# Patient Record
Sex: Female | Born: 1980 | Hispanic: Yes | Marital: Married | Smoking: Never smoker
Health system: Southern US, Community
[De-identification: ages and names within clinical notes are randomized; demographics above are authoritative.]

---

## 2004-08-11 ENCOUNTER — Ambulatory Visit: Payer: Self-pay | Admitting: Family Medicine

## 2004-11-03 ENCOUNTER — Ambulatory Visit: Payer: Self-pay | Admitting: Family Medicine

## 2005-02-09 ENCOUNTER — Inpatient Hospital Stay: Payer: Self-pay | Admitting: Certified Nurse Midwife

## 2007-03-13 ENCOUNTER — Encounter: Payer: Self-pay | Admitting: Maternal & Fetal Medicine

## 2007-03-13 ENCOUNTER — Ambulatory Visit: Payer: Self-pay | Admitting: Certified Nurse Midwife

## 2007-03-17 ENCOUNTER — Encounter: Payer: Self-pay | Admitting: Maternal & Fetal Medicine

## 2007-06-02 ENCOUNTER — Encounter: Payer: Self-pay | Admitting: Maternal & Fetal Medicine

## 2007-06-30 ENCOUNTER — Encounter: Payer: Self-pay | Admitting: Maternal & Fetal Medicine

## 2009-06-11 ENCOUNTER — Emergency Department: Payer: Self-pay | Admitting: Emergency Medicine

## 2009-06-14 ENCOUNTER — Other Ambulatory Visit: Payer: Self-pay | Admitting: Emergency Medicine

## 2011-04-26 ENCOUNTER — Encounter: Payer: Self-pay | Admitting: Maternal and Fetal Medicine

## 2011-05-24 ENCOUNTER — Encounter: Payer: Self-pay | Admitting: Obstetrics and Gynecology

## 2011-05-29 ENCOUNTER — Encounter: Payer: Self-pay | Admitting: Pediatric Cardiology

## 2011-10-24 ENCOUNTER — Inpatient Hospital Stay: Payer: Self-pay | Admitting: Obstetrics and Gynecology

## 2011-10-24 LAB — CBC WITH DIFFERENTIAL/PLATELET
Basophil %: 1.2 %
Eosinophil #: 0.1 10*3/uL (ref 0.0–0.7)
Eosinophil %: 0.8 %
HCT: 34.5 % — ABNORMAL LOW (ref 35.0–47.0)
HGB: 11.5 g/dL — ABNORMAL LOW (ref 12.0–16.0)
Lymphocyte %: 27 %
MCH: 28.7 pg (ref 26.0–34.0)
MCHC: 33.2 g/dL (ref 32.0–36.0)
Monocyte #: 0.5 x10 3/mm (ref 0.2–0.9)
Monocyte %: 7 %
Neutrophil #: 4.4 10*3/uL (ref 1.4–6.5)
Platelet: 246 10*3/uL (ref 150–440)
WBC: 6.8 10*3/uL (ref 3.6–11.0)

## 2011-10-25 LAB — HEMOGLOBIN: HGB: 10.5 g/dL — ABNORMAL LOW (ref 12.0–16.0)

## 2014-04-25 NOTE — Consult Note (Signed)
Referral Information:   Reason for Referral [facility] with history of pregnancy loss and prior child with complex congenital heart disease s/p death at 11 months of age.    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    Past Obstetrical Hx 1.  June 1997 Miscarriage (bleeding and labor) at "5 months" after "lifting something heavy".  Able to tell the infant was female.  Delivered at [facility].  Records reviewed and patient was [redacted]w[redacted]d (dated by a 12 week ultrasound), presented with PPROM and bleeding, suspected abruption.  Difficult breech extraction which required Duhrssen's incision to complete the delivery.  730 grams with apgars 1/1, neonatal death.   2.  12-Dec-1997Conceived 2 months after above loss and and miscarried at "3 months".  Was told it was due to the close interconceptual spacing and that her uterus was "too soft" to hold the pregnancy.  Delivered at [facility].  Records reviewed, presented with vaginal bleeding and bulging bag.  [place] biometry consistent with [redacted]w[redacted]d, about 530 grams.  Delivered "intact sac with stillborn fetus and placenta.Marland KitchenMarland Kitchen? clot adherent to placenta".  Placental pathology consistent with preterm placenta, no evidence of villitis or chorioamnionitis.  4x3cm area of blood clot attached at one margin of the placenta. 3.  2000 Treated for infertility with "pills" at [facility].  Conceived and had SVD at "9 months", female, about 7# at [facility] with close surveillance, no cerclage. 4.  2001 SVD at "9 months", female, about 7#.  [facility]. 5.  2007 SVD at "9 months", female, about 7#.  [facility]. 6.  2009 SVD at 39 weeks at [facility], demise at 85 months of age due to complex heart defect and heterotaxy syndrome.  Normal karyotype. 7.  2011 6 week SAB, no D&C.  [facility]. 8.   Current   Home Medications: Medication Status  pt denies any meds Active   Allergies:   NKA: None  Vital Signs/Notes:  Nursing Vital Signs: **Vital Signs.:   23-May-13 12:39   Vital Signs Type Routine   Pulse Pulse 81   Pulse source per Dinamap   Respirations Respirations 14   Systolic BP Systolic BP 107   Diastolic BP (mmHg) Diastolic BP (mmHg) 53   Mean BP 71   BP Source Dinamap   Perinatal Consult:   LMP 10-Feb-2011    PGyn Hx Denies history of abnormal PAPs or STDs    PMed Hx Hx of varicella    Past Medical History cont'd Denies    PSurg Hx Denies    FHx Mother with DM; denies any history of heart disease, stroke, clots, mental retardation or birth defects    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): 1.  June 1997.  Patient was 9753w2d (dated by a 12 week ultrasound), presented with PPROM (double footling breech) and bleeding with suspected abruption.  Was 8 cm on admission.  Difficult breech extraction, required Duhrssen's incision and application of Piper forceps to complete the delivery. Infant was 730 grams with apgars 1/1 and subsequently died without resucitation.  Clot adherent to placenta on inspection after delivery consistent with abruption.  Despite dictation that Duhrssen's incision was performed, on inspection "The cervix was not significantly lacerated and was without bleeding."  No repair was apparently required or  performed.  Pregnancy complicated by BV (treated) and endocervical polyp.   2.  December 1997. Conceived 2 months after above loss and and miscarried at "3 months" per patient.  Was told it was due to the close interconceptual spacing and that her uterus was "too soft" to hold the pregnancy.  [facility]. Records again reviewed, and patient apparently presented with vaginal bleeding and a bulging bag without palpable cervix.  US biometry was consistent with 4212w3d, about 530 grams.  She delivered "intact sac with stillborn fetus and placenta.Marland Kitchen.Marland Kitchen.? clot adherent to placenta".  Placental pathology consistent with preterm placenta, no evidence of villitis or chorioamnionitis.  4x3cm area of blood clot attached at one margin of the placenta.  Records from the pregnancy complicated by heterotaxy syndrome and congenital heart disease were available (in paper chart and in e-browser/[facility] system) and were also reviewed.  Lastly, US from the last pregnancy (first trimester loss) was reviewed - empty uterus despite + beta hCG.  Patient reports passing large clot, possible tissue prior to ultrasound.   Impression/Recommendations:   Impression 39JW [id number] at  with history of 2 second trimester losses (? due to abruption), one first trimester loss and 4 term deliveries, one of which was complicated by complex congenital heart disease and heterotaxy syndrome (normal karyotype).  First delivery also complicated by difficult breech extraction and performance of a Duhrssen's incision (which didn't apparently require a repair).  No other pregnancy after the first 2 were complicated by short cervix, bleeding or preterm delivery.    Recommendations 1.  Records from the first 2 pregnancies were received and reviewed (in paper chart currently). 2.  Despite having 4 term deliveries without incident of preterm labor or premature cervical dilation, would consider serial cervical length ultrasounds until 24 weeks (every 2  weeks) given documentation of a Duhrssen's incision.  Note that this may not be feasible financially for the patient given limited funding.  If not able to comply, would follow clinically for signs/symptoms of preterm labor. 3.  Detailed ultrasound performed today and fetal anatomy appeared normal, 4938w3d based on today's ultrasound given uncertain LMP.  Cervical length today (transabdominally) was over 4 cm. 4.  Recommend screening fetal echocardiogram at about 22 weeks. 5.  Defer parental karyotype given history of normal pregnancies and normal karyotype of the child with CHD.  Thrombophilia testing also deferred given 5 subsequent pregnancies without evidence of abruption. 6.  Discuss birth control options. 7.  Recommend taking prenatal vitamins.   Plan:   Prenatal Diagnosis Options Level II US    Delivery Mode Vaginal    Additional Testing Folate/prenatal vitamins     Total Time Spent with Patient 30 minutes    >50% of visit spent in couseling/coordination of care yes    Office Use Only [id number]  Level 2 (30min) NEW office consult exp prob focused   Coding Description:  MATERNAL CONDITIONS/HISTORY INDICATION(S).   Previous stillborn or neonatal death - Poor Reproductive History.  Electronic Signatures: Kirby Funk (MD)  (Signed 23-May-13 15:38)  Authored: Referral, Home Medications, Allergies, Vital Signs/Notes, Consult, Exam, Lab/Radiology Notes, Impression, Plan, Billing, Coding Description   Last Updated: 23-May-13 15:38 by Kirby Funk (MD)

## 2016-01-02 NOTE — L&D Delivery Note (Signed)
Delivery Note At 5:35 AM a viable and female  was delivered via Vaginal, Spontaneous. Precipitous labor Delivery (Presentation: ;vtx ).  OA  APGAR:9/9 , ; weight  .  Delayed cord clamping  Placenta status intact : , .  Cord:  with the following complications: .none   Cord pH:   Anesthesia:  None  Episiotomy:  none Lacerations:  none Suture Repair:n/a Est. Blood Loss (mL): 100cc   Mom to postpartum.  Baby to Couplet care / Skin to Skin.  Kelsey Gonzalez 08/19/2016, 5:41 AM

## 2016-03-09 ENCOUNTER — Other Ambulatory Visit: Payer: Self-pay | Admitting: Primary Care

## 2016-03-09 DIAGNOSIS — Z3482 Encounter for supervision of other normal pregnancy, second trimester: Secondary | ICD-10-CM

## 2016-03-14 ENCOUNTER — Ambulatory Visit
Admission: RE | Admit: 2016-03-14 | Discharge: 2016-03-14 | Disposition: A | Discharge status: Home / Self Care | Payer: Self-pay | Source: Ambulatory Visit | Attending: Primary Care | Admitting: Primary Care

## 2016-03-14 ENCOUNTER — Ambulatory Visit: Payer: Self-pay

## 2016-04-11 ENCOUNTER — Ambulatory Visit
Admission: RE | Admit: 2016-04-11 | Discharge: 2016-04-11 | Disposition: A | Discharge status: Home / Self Care | Payer: Medicaid Other | Source: Ambulatory Visit | Attending: Primary Care | Admitting: Primary Care

## 2016-04-11 ENCOUNTER — Encounter (HOSPITAL_COMMUNITY): Payer: Self-pay

## 2016-04-11 DIAGNOSIS — Z3689 Encounter for other specified antenatal screening: Secondary | ICD-10-CM | POA: Diagnosis not present

## 2016-04-11 DIAGNOSIS — Z3A21 21 weeks gestation of pregnancy: Secondary | ICD-10-CM | POA: Diagnosis not present

## 2016-04-11 DIAGNOSIS — Z3482 Encounter for supervision of other normal pregnancy, second trimester: Secondary | ICD-10-CM

## 2016-08-19 ENCOUNTER — Inpatient Hospital Stay
Admission: EM | Admit: 2016-08-19 | Discharge: 2016-08-21 | DRG: 775 | Disposition: A | Discharge status: Home / Self Care | Payer: Medicaid Other | Attending: Obstetrics and Gynecology | Admitting: Obstetrics and Gynecology

## 2016-08-19 DIAGNOSIS — Z3A38 38 weeks gestation of pregnancy: Secondary | ICD-10-CM

## 2016-08-19 DIAGNOSIS — Z3493 Encounter for supervision of normal pregnancy, unspecified, third trimester: Secondary | ICD-10-CM | POA: Diagnosis present

## 2016-08-19 DIAGNOSIS — O479 False labor, unspecified: Secondary | ICD-10-CM | POA: Diagnosis present

## 2016-08-19 LAB — TYPE AND SCREEN
ABO/RH(D): O POS
Antibody Screen: NEGATIVE

## 2016-08-19 LAB — CBC
HCT: 34.9 % — ABNORMAL LOW (ref 35.0–47.0)
Hemoglobin: 11.7 g/dL — ABNORMAL LOW (ref 12.0–16.0)
MCH: 28.7 pg (ref 26.0–34.0)
MCHC: 33.6 g/dL (ref 32.0–36.0)
MCV: 85.6 fL (ref 80.0–100.0)
Platelets: 251 10*3/uL (ref 150–440)
RBC: 4.08 MIL/uL (ref 3.80–5.20)
RDW: 16.1 % — AB (ref 11.5–14.5)
WBC: 7.7 10*3/uL (ref 3.6–11.0)

## 2016-08-19 LAB — RAPID HIV SCREEN (HIV 1/2 AB+AG)
HIV 1/2 ANTIBODIES: NONREACTIVE
HIV-1 P24 ANTIGEN - HIV24: NONREACTIVE

## 2016-08-19 LAB — DIFFERENTIAL
BASOS ABS: 0 10*3/uL (ref 0–0.1)
BASOS PCT: 0 %
EOS ABS: 0 10*3/uL (ref 0–0.7)
Eosinophils Relative: 0 %
Lymphocytes Relative: 15 %
Lymphs Abs: 1.2 10*3/uL (ref 1.0–3.6)
Monocytes Absolute: 0.4 10*3/uL (ref 0.2–0.9)
Monocytes Relative: 6 %
NEUTROS PCT: 79 %
Neutro Abs: 6.1 10*3/uL (ref 1.4–6.5)

## 2016-08-19 MED ORDER — SODIUM CHLORIDE 0.9 % IV SOLN
2.0000 g | Freq: Once | INTRAVENOUS | Status: AC
  Administered 2016-08-19: 2 g via INTRAVENOUS

## 2016-08-19 MED ORDER — BUTORPHANOL TARTRATE 1 MG/ML IJ SOLN
2.0000 mg | INTRAMUSCULAR | Status: DC | PRN
Start: 2016-08-19 — End: 2016-08-21

## 2016-08-19 MED ORDER — WITCH HAZEL-GLYCERIN EX PADS: 1.0000 "application " | MEDICATED_PAD | CUTANEOUS | Status: DC | PRN

## 2016-08-19 MED ORDER — LIDOCAINE HCL (PF) 1 % IJ SOLN: 30.0000 mL | INTRAMUSCULAR | Status: DC | PRN

## 2016-08-19 MED ORDER — BUTORPHANOL TARTRATE 1 MG/ML IJ SOLN: 1.0000 mg | INTRAMUSCULAR | Status: DC | PRN

## 2016-08-19 MED ORDER — ONDANSETRON HCL 4 MG/2ML IJ SOLN: 4.0000 mg | Freq: Four times a day (QID) | INTRAMUSCULAR | Status: DC | PRN

## 2016-08-19 MED ORDER — OXYCODONE-ACETAMINOPHEN 5-325 MG PO TABS: 2.0000 | ORAL_TABLET | ORAL | Status: DC | PRN

## 2016-08-19 MED ORDER — MAGNESIUM HYDROXIDE 400 MG/5ML PO SUSP: 30.0000 mL | ORAL | Status: DC | PRN

## 2016-08-19 MED ORDER — OXYTOCIN 40 UNITS IN LACTATED RINGERS INFUSION - SIMPLE MED: 2.5000 [IU]/h | INTRAVENOUS | Status: DC

## 2016-08-19 MED ORDER — DIPHENHYDRAMINE HCL 25 MG PO CAPS: 25.0000 mg | ORAL_CAPSULE | Freq: Four times a day (QID) | ORAL | Status: DC | PRN

## 2016-08-19 MED ORDER — BENZOCAINE-MENTHOL 20-0.5 % EX AERO
1.0000 "application " | INHALATION_SPRAY | CUTANEOUS | Status: DC | PRN
  Filled 2016-08-19: qty 56

## 2016-08-19 MED ORDER — LIDOCAINE HCL (PF) 1 % IJ SOLN
INTRAMUSCULAR | Status: DC
Start: 2016-08-19 — End: 2016-08-19
  Filled 2016-08-19: qty 30

## 2016-08-19 MED ORDER — SOD CITRATE-CITRIC ACID 500-334 MG/5ML PO SOLN: 30.0000 mL | ORAL | Status: DC | PRN

## 2016-08-19 MED ORDER — OXYTOCIN BOLUS FROM INFUSION
500.0000 mL | Freq: Once | INTRAVENOUS | Status: AC
  Administered 2016-08-19: 500 mL via INTRAVENOUS

## 2016-08-19 MED ORDER — OXYCODONE-ACETAMINOPHEN 5-325 MG PO TABS
1.0000 | ORAL_TABLET | ORAL | Status: DC | PRN
Start: 2016-08-19 — End: 2016-08-19
  Administered 2016-08-19: 1 via ORAL
  Filled 2016-08-19: qty 1

## 2016-08-19 MED ORDER — ONDANSETRON HCL 4 MG/2ML IJ SOLN: 4.0000 mg | INTRAMUSCULAR | Status: DC | PRN

## 2016-08-19 MED ORDER — SENNOSIDES-DOCUSATE SODIUM 8.6-50 MG PO TABS
2.0000 | ORAL_TABLET | ORAL | Status: DC
  Administered 2016-08-20 – 2016-08-21 (×2): 2 via ORAL
  Filled 2016-08-19 (×2): qty 2

## 2016-08-19 MED ORDER — METHYLERGONOVINE MALEATE 0.2 MG/ML IJ SOLN
INTRAMUSCULAR | Status: AC
  Administered 2016-08-19: 0.2 mg
  Filled 2016-08-19: qty 1

## 2016-08-19 MED ORDER — LACTATED RINGERS IV SOLN: 500.0000 mL | INTRAVENOUS | Status: DC | PRN

## 2016-08-19 MED ORDER — PRENATAL MULTIVITAMIN CH
1.0000 | ORAL_TABLET | Freq: Every day | ORAL | Status: DC
  Administered 2016-08-19 – 2016-08-21 (×3): 1 via ORAL
  Filled 2016-08-19 (×3): qty 1

## 2016-08-19 MED ORDER — ONDANSETRON HCL 4 MG PO TABS: 4.0000 mg | ORAL_TABLET | ORAL | Status: DC | PRN

## 2016-08-19 MED ORDER — AMMONIA AROMATIC IN INHA
RESPIRATORY_TRACT | Status: DC
Start: 2016-08-19 — End: 2016-08-19
  Filled 2016-08-19: qty 10

## 2016-08-19 MED ORDER — SODIUM CHLORIDE 0.9 % IV SOLN
INTRAVENOUS | Status: AC
  Filled 2016-08-19: qty 2000

## 2016-08-19 MED ORDER — LACTATED RINGERS IV SOLN
INTRAVENOUS | Status: DC
  Administered 2016-08-19: 05:00:00 via INTRAVENOUS

## 2016-08-19 MED ORDER — ACETAMINOPHEN 325 MG PO TABS
650.0000 mg | ORAL_TABLET | ORAL | Status: DC | PRN
Start: 2016-08-19 — End: 2016-08-21

## 2016-08-19 MED ORDER — MISOPROSTOL 200 MCG PO TABS
ORAL_TABLET | ORAL | Status: AC
  Filled 2016-08-19: qty 4

## 2016-08-19 MED ORDER — ACETAMINOPHEN 325 MG PO TABS
650.0000 mg | ORAL_TABLET | ORAL | Status: DC | PRN
  Filled 2016-08-19: qty 2

## 2016-08-19 MED ORDER — ZOLPIDEM TARTRATE 5 MG PO TABS: 5.0000 mg | ORAL_TABLET | Freq: Every evening | ORAL | Status: DC | PRN

## 2016-08-19 MED ORDER — DIBUCAINE 1 % RE OINT: 1.0000 "application " | TOPICAL_OINTMENT | RECTAL | Status: DC | PRN

## 2016-08-19 MED ORDER — IBUPROFEN 600 MG PO TABS
ORAL_TABLET | ORAL | Status: AC
  Filled 2016-08-19: qty 1

## 2016-08-19 MED ORDER — FERROUS SULFATE 325 (65 FE) MG PO TABS
325.0000 mg | ORAL_TABLET | Freq: Two times a day (BID) | ORAL | Status: DC
  Administered 2016-08-19 – 2016-08-21 (×4): 325 mg via ORAL
  Filled 2016-08-19 (×4): qty 1

## 2016-08-19 MED ORDER — OXYTOCIN 10 UNIT/ML IJ SOLN
INTRAMUSCULAR | Status: AC
  Filled 2016-08-19: qty 2

## 2016-08-19 MED ORDER — OXYTOCIN 40 UNITS IN LACTATED RINGERS INFUSION - SIMPLE MED
INTRAVENOUS | Status: AC
  Filled 2016-08-19: qty 1000

## 2016-08-19 MED ORDER — SIMETHICONE 80 MG PO CHEW: 80.0000 mg | CHEWABLE_TABLET | ORAL | Status: DC | PRN

## 2016-08-19 MED ORDER — COCONUT OIL OIL: 1.0000 "application " | TOPICAL_OIL | Status: DC | PRN

## 2016-08-19 MED ORDER — MEASLES, MUMPS & RUBELLA VAC ~~LOC~~ INJ
0.5000 mL | INJECTION | Freq: Once | SUBCUTANEOUS | Status: DC
  Filled 2016-08-19: qty 0.5

## 2016-08-19 MED ORDER — SODIUM CHLORIDE 0.9 % IV SOLN
1.0000 g | INTRAVENOUS | Status: DC
  Filled 2016-08-19 (×7): qty 1000

## 2016-08-19 MED ORDER — IBUPROFEN 600 MG PO TABS
600.0000 mg | ORAL_TABLET | Freq: Four times a day (QID) | ORAL | Status: DC
  Administered 2016-08-19 – 2016-08-21 (×10): 600 mg via ORAL
  Filled 2016-08-19 (×9): qty 1

## 2016-08-19 NOTE — H&P (Signed)
Kelsey Gonzalez is a 36 y.o. female presenting forlabor  No prenatal records available . portable translator not available and none at [facility] currently  OB History    Gravida Para Term Preterm AB Living   7 4 4   2      SAB TAB Ectopic Multiple Live Births   2       4     History reviewed. No pertinent past medical history. History reviewed. No pertinent surgical history. Family History: family history is not on file. Social History:  reports that she has never smoked. She has never used smokeless tobacco. She reports that she does not drink alcohol or use drugs.     ROS History Dilation: 4 Effacement (%): 80 Station: -2 Exam by:: T.Roberts RN Temperature 98.7 F (37.1 C), temperature source Oral, resp. rate 20, last menstrual period 11/24/2015.   cx 6 cm / c/ 0  AROM bloody  Exam Physical Exam  Prenatal labs: ABO, Rh:   Antibody:   Rubella:   RPR:    HBsAg:    HIV:    GBS:     Assessment/Plan: Active labor , No records  Reassuring fetal monitoring   Kelsey Gonzalez 08/19/2016, 5:17 AM

## 2016-08-19 NOTE — Discharge Summary (Signed)
Obstetrical Discharge Summary  Patient Name: Kelsey Gonzalez DOB: [birth year] MRN: [id number]  Date of Admission: 08/19/2016 Date of Delivery: 08/21/2016 (manually enter date) Delivered [id number] MD Date of Discharge: 08/21/16 Primary OB: Phineas Real  JIR:[id number]'O last menstrual period was 11/24/2015. EDC Estimated Date of Delivery: 08/30/16 Gestational Age at Delivery: [redacted]w[redacted]d   Antepartum complications:none Admitting Diagnosis:  Secondary Diagnosis: Patient Active Problem List   Diagnosis Date Noted  . Uterine contractions during pregnancy 08/19/2016    Augmentation: none Complications: None Intrapartum complications/course:  Date of Delivery:  Delivered Kelsey Gonzalez  Delivery Type: spontaneous vaginal delivery Anesthesia:  Placenta: sponatneous Laceration: none Episiotomy: none Newborn Data: Live born female Birth Weight:   APGAR: , 9/9    Postpartum Procedures: none  Post partum course: uncomplicated  Patient had an uncomplicated postpartum course.  By time of discharge on PPD#2, her pain was controlled on oral pain medications; she had appropriate lochia and was ambulating, voiding without difficulty and tolerating regular diet.  She was deemed stable for discharge to home.    Discharge Physical Exam: 08/21/16 BP (!) 99/59 (BP Location: Left Arm)   Pulse 63   Temp 98.1 F (36.7 C) (Oral)   Resp 18   Ht 5\' 5"  (1.651 m)   Wt 81.6 kg (180 lb)   LMP 11/24/2015   SpO2 100%   BMI 29.95 kg/m   General: NAD CV: RRR Pulm: CTABL, nl effort ABD: s/nd/nt, fundus firm and below the umbilicus Lochia: moderate Incision: c/d/i DVT Evaluation: LE non-ttp, no evidence of DVT on exam.  Hemoglobin  Date Value Ref Range Status  08/20/2016 10.0 (L) 12.0 - 16.0 g/dL Final   HGB  Date Value Ref Range Status  10/25/2011 10.5 (L) 12.0 - 16.0 g/dL Final   HCT  Date Value Ref Range Status  08/20/2016 30.3 (L) 35.0 - 47.0 % Final  10/24/2011 34.5  (L) 35.0 - 47.0 % Final     Disposition: stable, discharge to home. Baby Feeding: breastmilk Baby Disposition: home with mom  Rh Immune globulin given:  Rubella vaccine given:  Tdap vaccine given in AP or PP setting:  Flu vaccine given in AP or PP setting:   Contraception:between IUD and BTL  If BTL need medicaid consent signed before d/c from hospital and f/up with me at [facility] 3 weeks to set up surgery  Prenatal Labs:     Plan:  Kelsey Gonzalez was discharged to home in good condition. Follow-up appointment with delivering provider in 6 weeks.  Discharge Medications: Allergies as of 08/21/2016   No Known Allergies     Medication List    TAKE these medications   ibuprofen 600 MG tablet Commonly known as:  ADVIL,MOTRIN Take 1 tablet (600 mg total) by mouth every 6 (six) hours.         Signed: Ihor Austin Schermerhorn MD

## 2016-08-20 LAB — CBC
HCT: 30.3 % — ABNORMAL LOW (ref 35.0–47.0)
Hemoglobin: 10 g/dL — ABNORMAL LOW (ref 12.0–16.0)
MCH: 28.6 pg (ref 26.0–34.0)
MCHC: 32.9 g/dL (ref 32.0–36.0)
MCV: 86.7 fL (ref 80.0–100.0)
PLATELETS: 226 10*3/uL (ref 150–440)
RBC: 3.49 MIL/uL — AB (ref 3.80–5.20)
RDW: 16.3 % — AB (ref 11.5–14.5)
WBC: 6.8 10*3/uL (ref 3.6–11.0)

## 2016-08-20 NOTE — Progress Notes (Signed)
Post Partum Day 1 Subjective: "No Delour"  Objective: Blood pressure 109/69, pulse 72, temperature 97.8 F (36.6 C), temperature source Oral, resp. rate 18, height 5\' 5"  (1.651 m), weight 81.6 kg (180 lb), last menstrual period 11/24/2015, SpO2 100 %.  Physical Exam:  General:a,A,&O x3 Heart: S1S2, RRR, No M/R/G Lungs: CTA bilat, no W/R/R Lochia: mod, no clots Uterine Fundus: Fundus firm Incision: none DVT Evaluation: Neg Homans   Recent Labs  08/19/16 0609 08/20/16 0425  HGB 11.7* 10.0*  HCT 34.9* 30.3*    Assessment/Plan: A:1. PPD#1 stable 2. GBS unknown P: Pt has to stay till am since baby is GBS unknown. Interpreter to discuss with pt and family. 2. DC in am.   LOS: 1 day   Sharee Pimple 08/20/2016, 8:47 AM

## 2016-08-20 NOTE — Progress Notes (Signed)
Kelsey Gonzalez notified for [facility] records

## 2016-08-21 ENCOUNTER — Encounter: Payer: Self-pay | Admitting: *Deleted

## 2016-08-21 LAB — HEPATITIS B SURFACE ANTIGEN: Hepatitis B Surface Ag: NEGATIVE

## 2016-08-21 LAB — RUBELLA SCREEN: RUBELLA: 2.34 {index} (ref >0.99)

## 2016-08-21 LAB — RPR: RPR: NONREACTIVE

## 2016-08-21 MED ORDER — IBUPROFEN 600 MG PO TABS: 600.0000 mg | ORAL_TABLET | Freq: Four times a day (QID) | ORAL | 0 refills | Status: AC

## 2016-08-21 MED ORDER — VARICELLA VIRUS VACCINE LIVE 1350 PFU/0.5ML IJ SUSR
0.5000 mL | Freq: Once | INTRAMUSCULAR | Status: AC
  Administered 2016-08-21: 0.5 mL via SUBCUTANEOUS
  Filled 2016-08-21: qty 0.5

## 2016-08-21 NOTE — Progress Notes (Signed)
D/C instructions provided,with interpreter, pt states understanding, aware of follow up appt.  Prescription given to pt.

## 2016-08-21 NOTE — Progress Notes (Signed)
D/C home to car via auxiliary in wheelchair.  

## 2016-08-28 ENCOUNTER — Encounter: Payer: Self-pay | Admitting: Emergency Medicine

## 2016-08-28 ENCOUNTER — Emergency Department: Payer: Self-pay

## 2016-08-28 LAB — COMPREHENSIVE METABOLIC PANEL
ALK PHOS: 187 U/L — AB (ref 38–126)
ALT: 38 U/L (ref 14–54)
ANION GAP: 9 (ref 5–15)
AST: 26 U/L (ref 15–41)
Albumin: 3.4 g/dL — ABNORMAL LOW (ref 3.5–5.0)
BUN: 17 mg/dL (ref 6–20)
CALCIUM: 8.7 mg/dL — AB (ref 8.9–10.3)
CO2: 23 mmol/L (ref 22–32)
CREATININE: 0.88 mg/dL (ref 0.44–1.00)
Chloride: 105 mmol/L (ref 101–111)
GFR calc Af Amer: >60 mL/min (ref >60)
GFR calc non Af Amer: >60 mL/min (ref >60)
Glucose, Bld: 89 mg/dL (ref 65–99)
Potassium: 3.9 mmol/L (ref 3.5–5.1)
SODIUM: 137 mmol/L (ref 135–145)
Total Bilirubin: 0.5 mg/dL (ref 0.3–1.2)
Total Protein: 7.6 g/dL (ref 6.5–8.1)

## 2016-08-28 LAB — CBC
HCT: 37.4 % (ref 35.0–47.0)
Hemoglobin: 12.4 g/dL (ref 12.0–16.0)
MCH: 28.9 pg (ref 26.0–34.0)
MCHC: 33.1 g/dL (ref 32.0–36.0)
MCV: 87.4 fL (ref 80.0–100.0)
PLATELETS: 389 10*3/uL (ref 150–440)
RBC: 4.28 MIL/uL (ref 3.80–5.20)
RDW: 17.3 % — AB (ref 11.5–14.5)
WBC: 9 10*3/uL (ref 3.6–11.0)

## 2016-08-28 LAB — URINALYSIS, COMPLETE (UACMP) WITH MICROSCOPIC
Bacteria, UA: NONE SEEN
Bilirubin Urine: NEGATIVE
Glucose, UA: NEGATIVE mg/dL
Ketones, ur: NEGATIVE mg/dL
Nitrite: NEGATIVE
Protein, ur: 30 mg/dL — AB
SPECIFIC GRAVITY, URINE: 1.025 (ref 1.005–1.030)
pH: 5 (ref 5.0–8.0)

## 2016-08-28 LAB — PREGNANCY, URINE: PREG TEST UR: NEGATIVE

## 2016-08-28 NOTE — ED Triage Notes (Addendum)
Pt to triage via w/c with no distress noted; pt postpartum 1-2wks; up to BR and noted blood clot that passed; denies any pain but st bleeding persists

## 2016-08-29 ENCOUNTER — Emergency Department
Admission: EM | Admit: 2016-08-29 | Discharge: 2016-08-29 | Disposition: A | Discharge status: Home / Self Care | Payer: Self-pay | Attending: Emergency Medicine | Admitting: Emergency Medicine

## 2016-08-29 NOTE — ED Provider Notes (Signed)
[facility] Emergency Department Provider Note   First MD Initiated Contact with Patient 08/29/16 0123     (approximate)  I have reviewed the triage vital signs and the nursing notes.   HISTORY  Chief Complaint Vaginal Bleeding    HPI Kelsey Gonzalez is a 36 y.o. female 10 weeks postpartum status post vaginal delivery presents to the emergency department with history of "passing a blood clot tonight when she went to the bathroom followed by vaginal bleeding which has since improved. Patient denies any abdominal pain no fever.   Past medical history Vaginal delivery 10 days ago  Patient Active Problem List   Diagnosis Date Noted  . Uterine contractions during pregnancy 08/19/2016    History reviewed. No pertinent surgical history.  Prior to Admission medications   Medication Sig Start Date End Date Taking? Authorizing Provider  ibuprofen (ADVIL,MOTRIN) 600 MG tablet Take 1 tablet (600 mg total) by mouth every 6 (six) hours. 08/21/16   Schermerhorn, Ihor Austin, MD    Allergies No known drug allergies No family history on file.  Social History Social History  Substance Use Topics  . Smoking status: Never Smoker  . Smokeless tobacco: Never Used  . Alcohol use No    Review of Systems Constitutional: No fever/chills Eyes: No visual changes. ENT: No sore throat. Cardiovascular: Denies chest pain. Respiratory: Denies shortness of breath. Gastrointestinal: No abdominal pain.  No nausea, no vomiting.  No diarrhea.  No constipation. Genitourinary: Negative for dysuria.Positive vaginal bleeding Musculoskeletal: Negative for neck pain.  Negative for back pain. Integumentary: Negative for rash. Neurological: Negative for headaches, focal weakness or numbness.   ____________________________________________   PHYSICAL EXAM:  VITAL SIGNS: ED Triage Vitals  Enc Vitals Group     BP 08/28/16 1908 132/78     Pulse Rate 08/28/16 1908 85     Resp 08/28/16 1908 18     Temp 08/28/16 1908 98.6 F (37 C)     Temp Source 08/28/16 1908 Oral     SpO2 08/28/16 1908 100 %     Weight 08/28/16 1910 81.6 kg (180 lb)     Height 08/28/16 1910 1.651 m (5\' 5" )     Head Circumference --      Peak Flow --      Pain Score --      Pain Loc --      Pain Edu? --      Excl. in GC? --     Constitutional: Alert and oriented. Well appearing and in no acute distress. Eyes: Conjunctivae are normal. Head: Atraumatic. Mouth/Throat: Mucous membranes are moist.  Oropharynx non-erythematous. Neck: No stridor.   Cardiovascular: Normal rate, regular rhythm. Good peripheral circulation. Grossly normal heart sounds. Respiratory: Normal respiratory effort.  No retractions. Lungs CTAB. Gastrointestinal: Soft and nontender. No distention.  Genitourinary: Moderate Vaginal bleeding noted from the cervical os no clots noted. Musculoskeletal: No lower extremity tenderness nor edema. No gross deformities of extremities. Neurologic:  Normal speech and language. No gross focal neurologic deficits are appreciated.  Skin:  Skin is warm, dry and intact. No rash noted. Psychiatric: Mood and affect are normal. Speech and behavior are normal.  ____________________________________________   LABS (all labs ordered are listed, but only abnormal results are displayed)  Labs Reviewed  CBC - Abnormal; Notable for the following:       Result Value   RDW 17.3 (*)    All other components within normal limits  URINALYSIS, COMPLETE (UACMP) WITH MICROSCOPIC -  Abnormal; Notable for the following:    Color, Urine YELLOW (*)    APPearance HAZY (*)    Hgb urine dipstick LARGE (*)    Protein, ur 30 (*)    Leukocytes, UA LARGE (*)    Squamous Epithelial / LPF 0-5 (*)    All other components within normal limits  COMPREHENSIVE METABOLIC PANEL - Abnormal; Notable for the following:    Calcium 8.7 (*)    Albumin 3.4 (*)    Alkaline Phosphatase 187 (*)    All other  components within normal limits  PREGNANCY, URINE     RADIOLOGY I, [place] Kyrollos Cordell, personally viewed and evaluated these images (plain radiographs) as part of my medical decision making, as well as reviewing the written report by the radiologist.  US Pelvis Complete  Result Date: 08/28/2016 CLINICAL DATA:  Postpartum bleeding.  Vaginal birth 1 week ago. EXAM: TRANSABDOMINAL ULTRASOUND OF PELVIS TECHNIQUE: Transabdominal ultrasound examination of the pelvis was performed including evaluation of the uterus, ovaries, adnexal regions, and pelvic cul-de-sac. COMPARISON:  None. FINDINGS: Uterus Measurements: 11.7 x 7.9 x 9.7 cm. No fibroids or other mass visualized. Endometrium Thickness: 3 mm in thickness. Fluid noted within the endometrial canal. No focal abnormality visualized. Right ovary Measurements: 3.0 x 81.3 x 2.2 cm. Normal appearance/no adnexal mass. Left ovary Measurements: 2.8 x 1.4 x 1.7 cm. Normal appearance/no adnexal mass. Other findings:  No abnormal free fluid. IMPRESSION: Small amount of fluid within the endometrial canal, likely with related to recent postpartum state. No visible retained products of conception. Electronically Signed   By: Charlett Nose M.D.   On: 08/28/2016 20:29    ____________________________________________    Procedures   ____________________________________________   INITIAL IMPRESSION / ASSESSMENT AND PLAN / ED COURSE  Pertinent labs & imaging results that were available during my care of the patient were reviewed by me and considered in my medical decision making (see chart for details).  36 year old female with above stated history presenting to the emergency department with vaginal bleeding 10 days status post vaginal delivery. Concern for possible retained products as such ultrasound was performed which revealed no evidence of retained products. On vaginal exam bleeding consistent with postpartum state. Patient with no abdominal discomfort on  exam. Also consider possibility of endometritis however patient with no abdominal pain or fever. Patient referred to Dr. Feliberto Gottron [facility] today.      ____________________________________________  FINAL CLINICAL IMPRESSION(S) / ED DIAGNOSES  Final diagnoses:  Postpartum hemorrhage, unspecified type     MEDICATIONS GIVEN DURING THIS VISIT:  Medications - No data to display   NEW OUTPATIENT MEDICATIONS STARTED DURING THIS VISIT:  New Prescriptions   No medications on file    Modified Medications   No medications on file    Discontinued Medications   No medications on file     Note:  This document was prepared using Dragon voice recognition software and may include unintentional dictation errors.    Darci Current, MD 08/29/16 [phone]

## 2018-08-21 IMAGING — US US OB COMP +14 WK
1 series · 13 of 28 positions shown · non-contrast
Comparison: none

CLINICAL DATA: Gestational age by LMP of 19 weeks 6 days. Evaluate
dating and anatomy.

EXAM:
OBSTETRICAL ULTRASOUND >14 WKS

[Series 1: us ob comp +14 wk · 0.22mm/px · 13 of 240 slices shown]
[im 9/240]
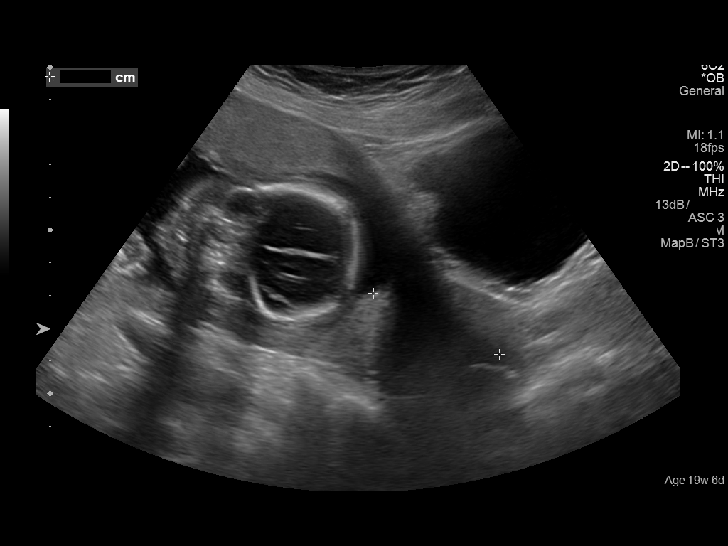
[im 27/240]
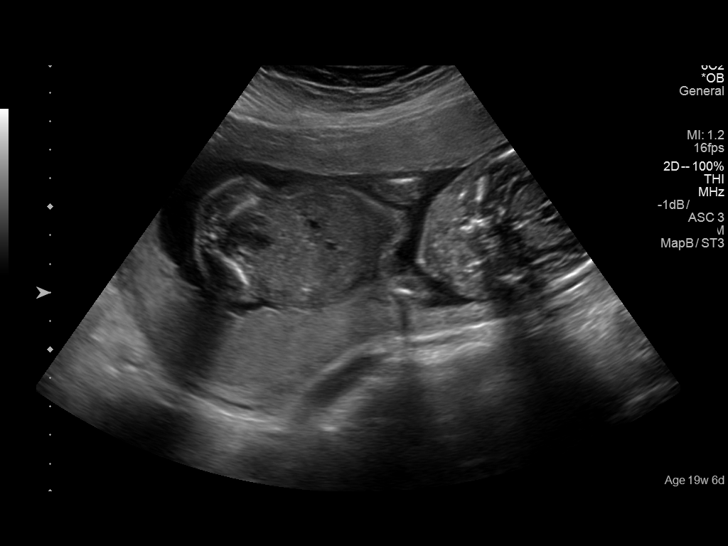
[im 45/240]
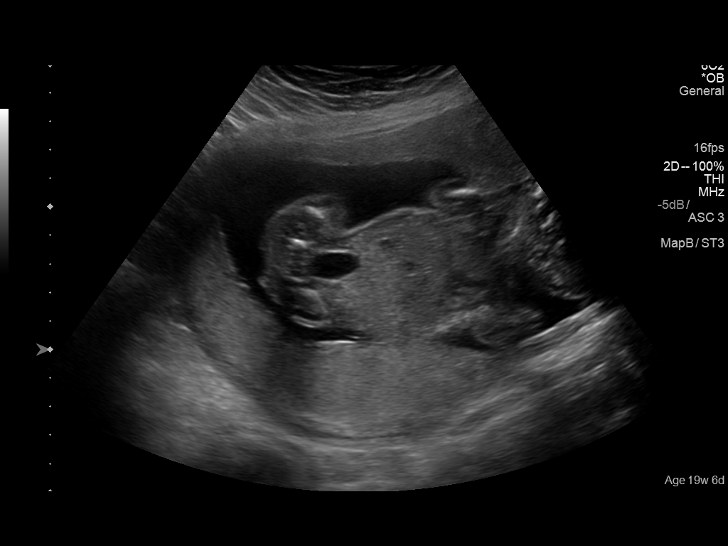
[im 62/240]
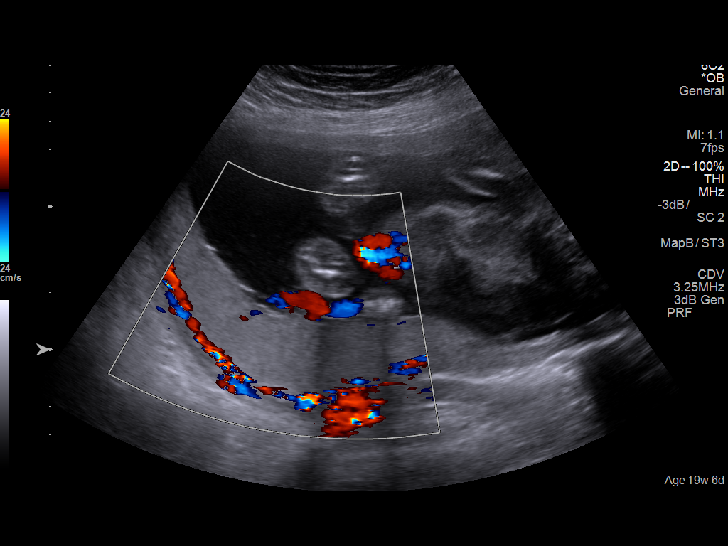
[im 80/240]
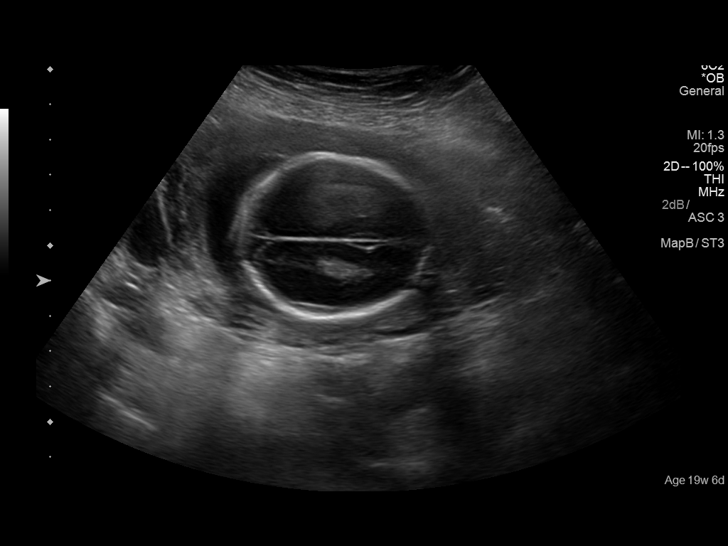
[im 98/240]
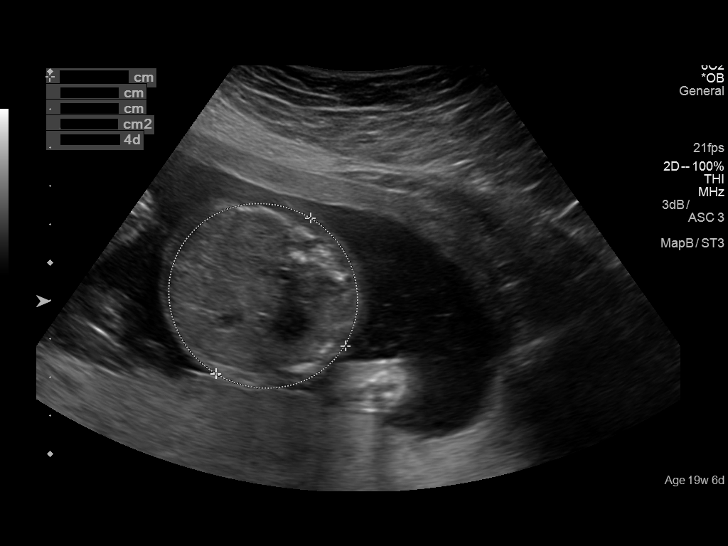
[im 124/240]
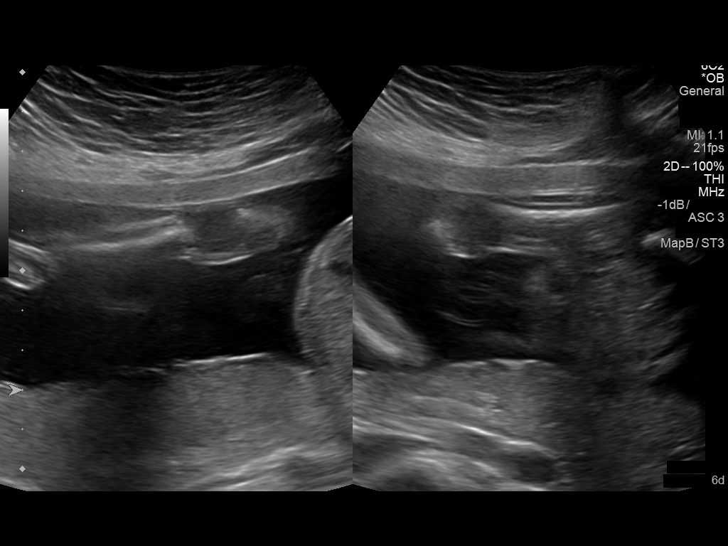
[im 142/240]
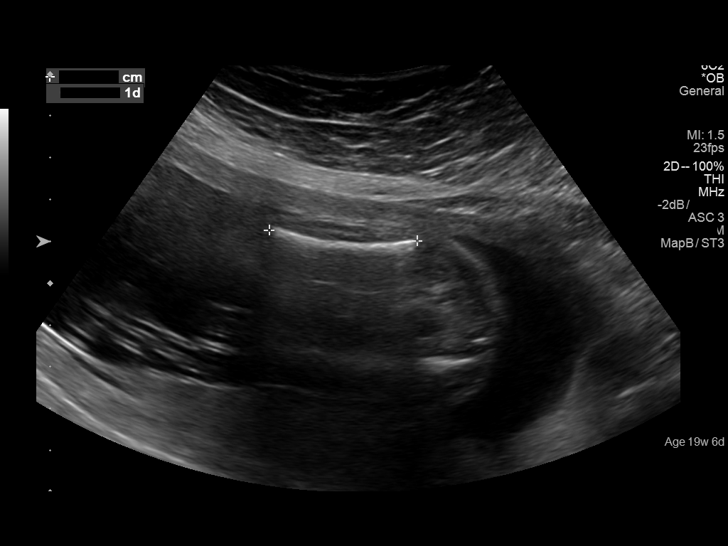
[im 160/240]
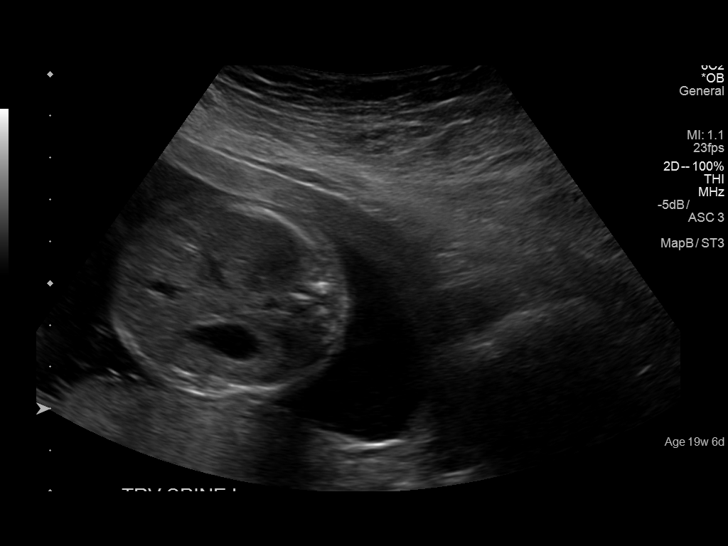
[im 178/240]
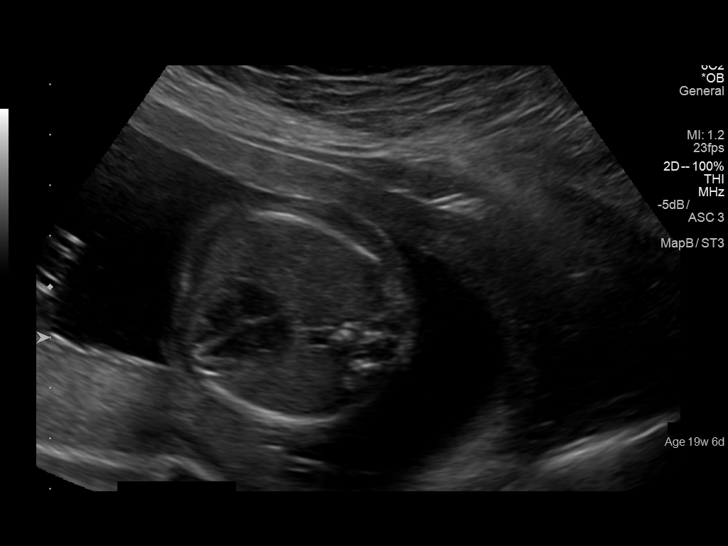
[im 195/240]
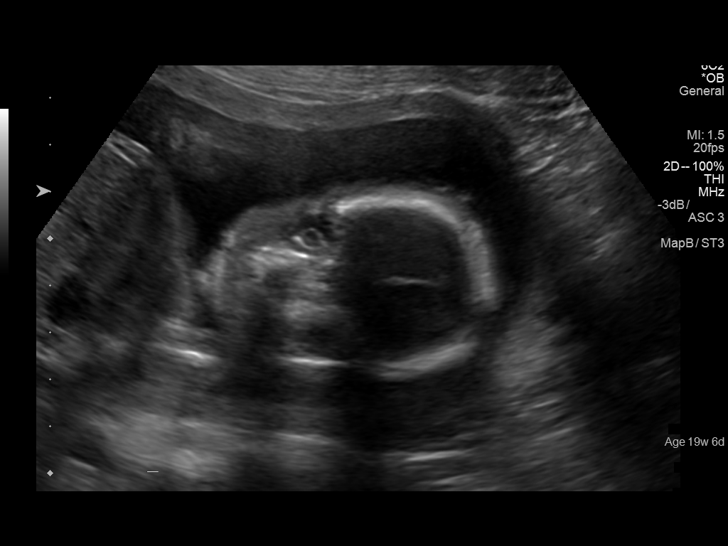
[im 213/240]
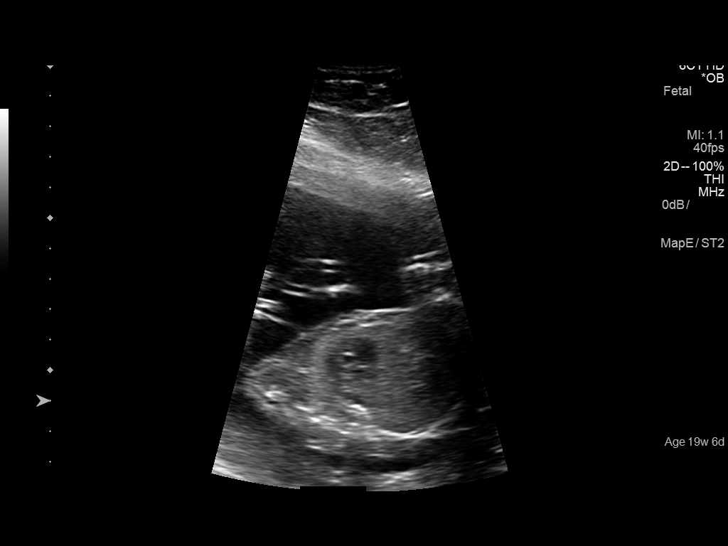
[im 231/240]
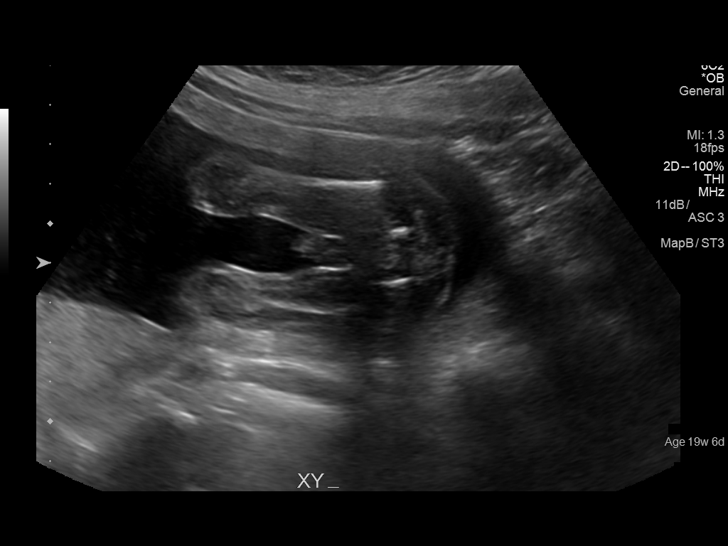

[13 of 28 positions shown; findings below may reference images not displayed]

FINDINGS: Number of Fetuses: 1

Heart Rate:  145 bpm

Movement: Yes

Presentation: Cephalic

Previa: No

Placental Location: Fundal and posterior

Amniotic Fluid (Subjective): Within normal limits

Amniotic Fluid (Objective):

Vertical pocket 5.0cm

FETAL BIOMETRY

BPD:  4.8cm 20w 4d

HC:    18.2cm  20w   4d

AC:   15.7cm  20w   6d

FL:   3.5cm  21w   1d

Current Mean GA: 21w 0d              US EDC: 08/22/2016

FETAL ANATOMY

Lateral Ventricles: Appears normal

Thalami/CSP: Appears normal

Posterior Fossa:  Appears normal

Nuchal Region: Appears normal

Upper Lip: Appears normal

Spine: Appears normal

4 Chamber Heart on Left: Appears normal

LVOT: Appears normal

RVOT: Appears normal

Stomach on Left: Appears normal

3 Vessel Cord: Appears normal

Cord Insertion site: Appears normal

Kidneys: Appears normal

Bladder: Appears normal

Extremities: Appears normal

Sex: Male

Maternal Findings:

Cervix:  4.3 cm TA
IMPRESSION: Single living intrauterine fetus measuring 21 weeks 0 days, with US
EDC of 08/22/2016.

No fetal anomalies seen involving visualized anatomy noted above.

## 2019-01-07 IMAGING — US US PELVIS COMPLETE
1 series · 14 of 25 positions shown · non-contrast
Comparison: None.

CLINICAL DATA: Postpartum bleeding.  Vaginal birth 1 week ago.

EXAM:
TRANSABDOMINAL ULTRASOUND OF PELVIS
TECHNIQUE: Transabdominal ultrasound examination of the pelvis was performed
including evaluation of the uterus, ovaries, adnexal regions, and
pelvic cul-de-sac.

[Series 1: us pelvis complete · 0.23mm/px · 14 of 71 slices shown]
[im 1/71]
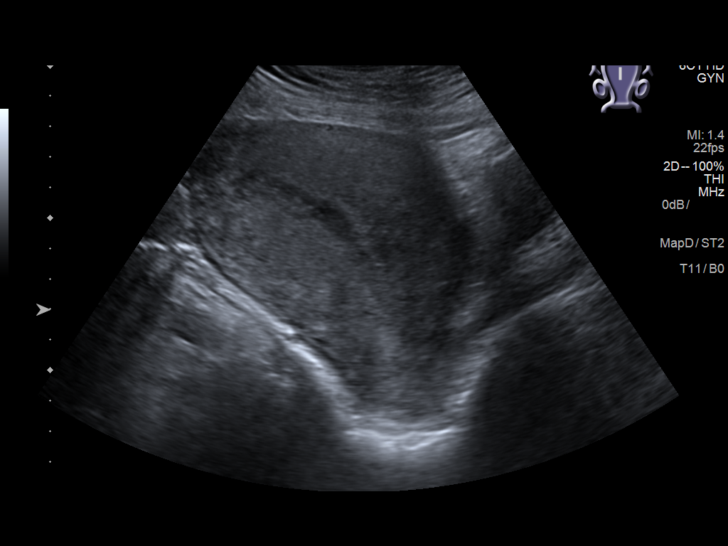
[im 6/71]
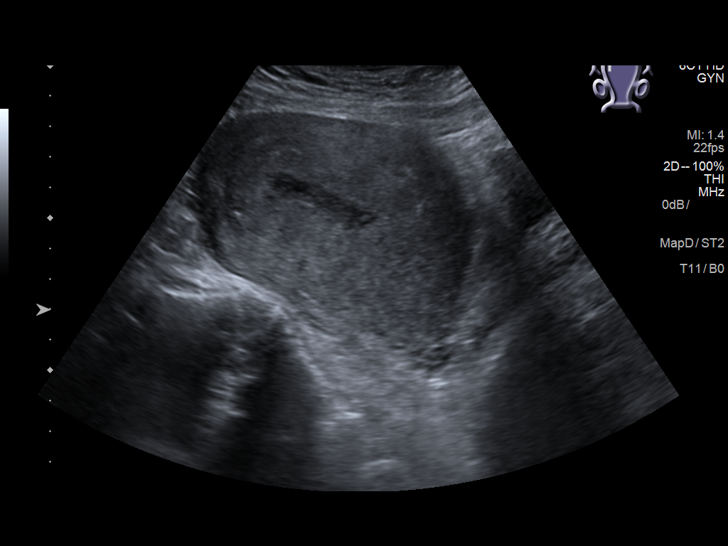
[im 12/71]
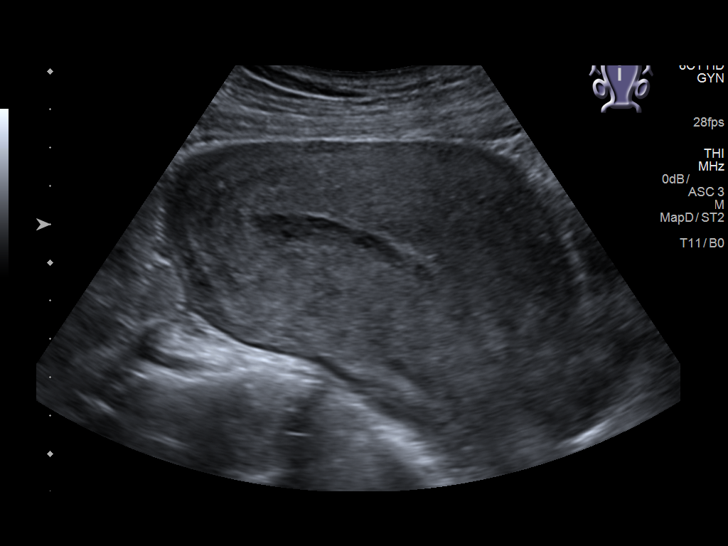
[im 18/71]
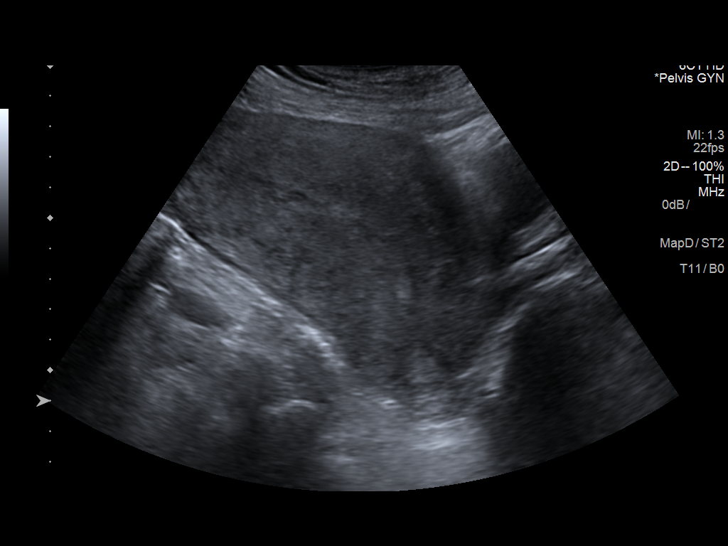
[im 24/71]
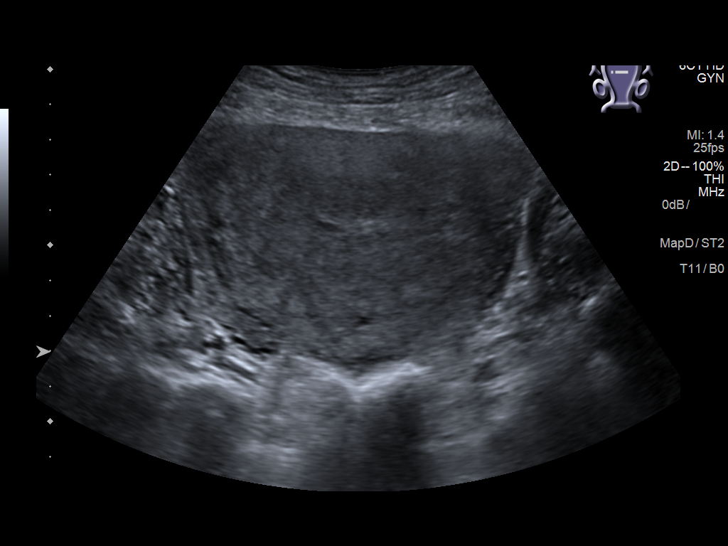
[im 27/71]
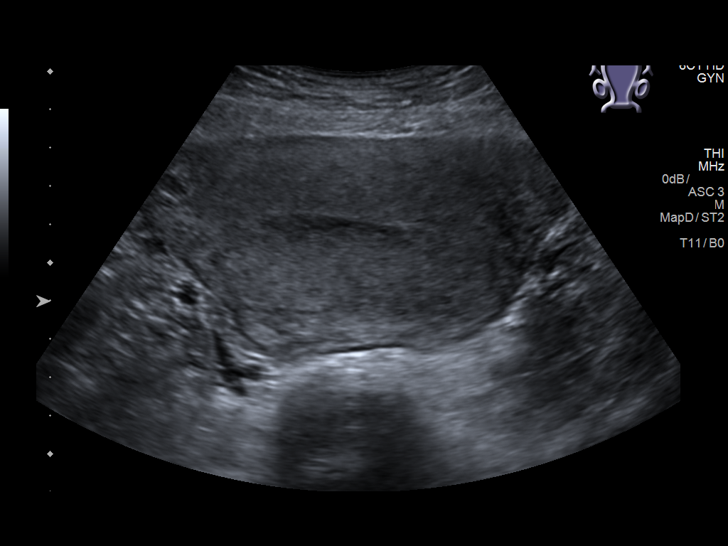
[im 33/71]
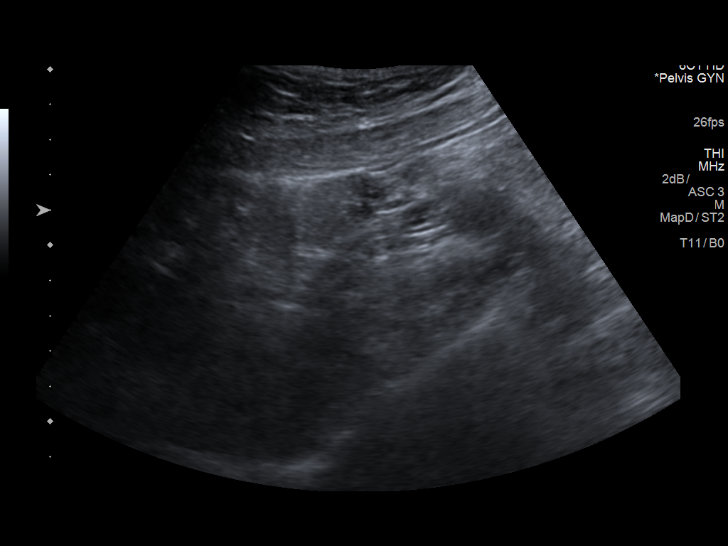
[im 38/71]
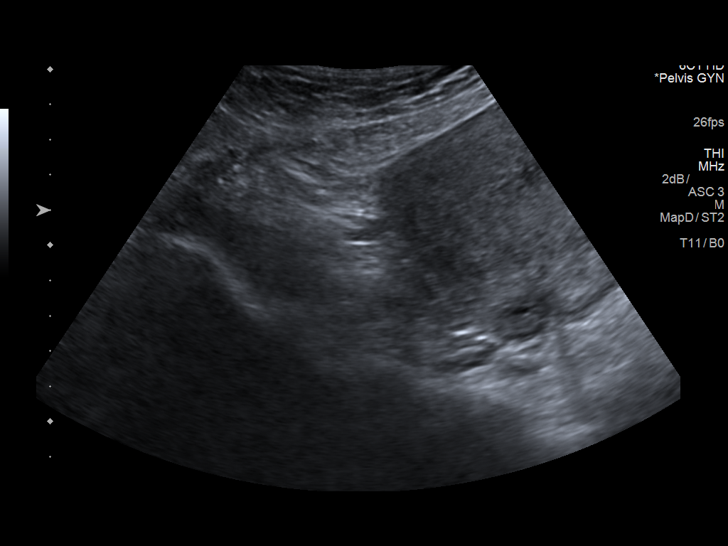
[im 44/71]
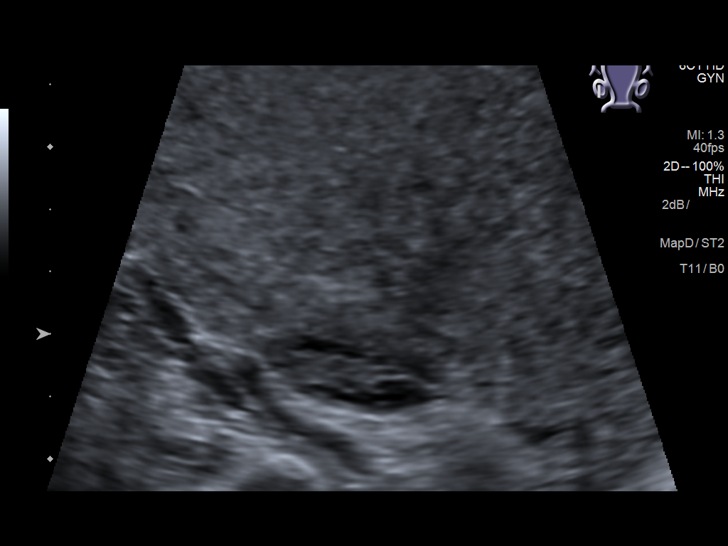
[im 47/71]
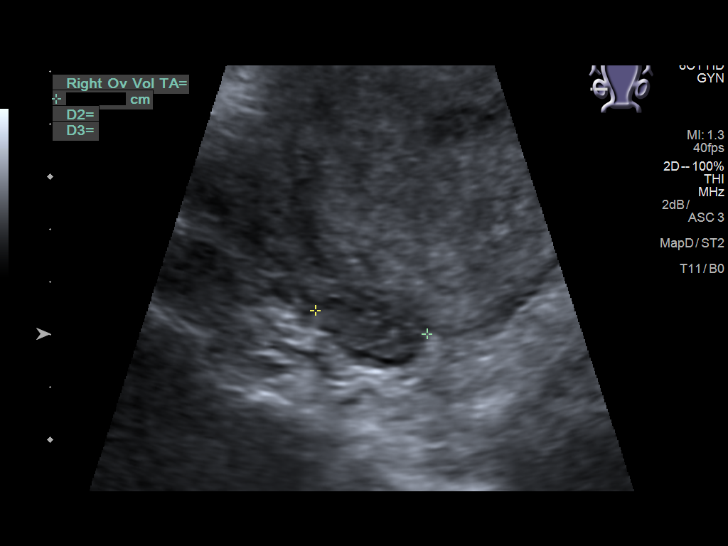
[im 53/71]
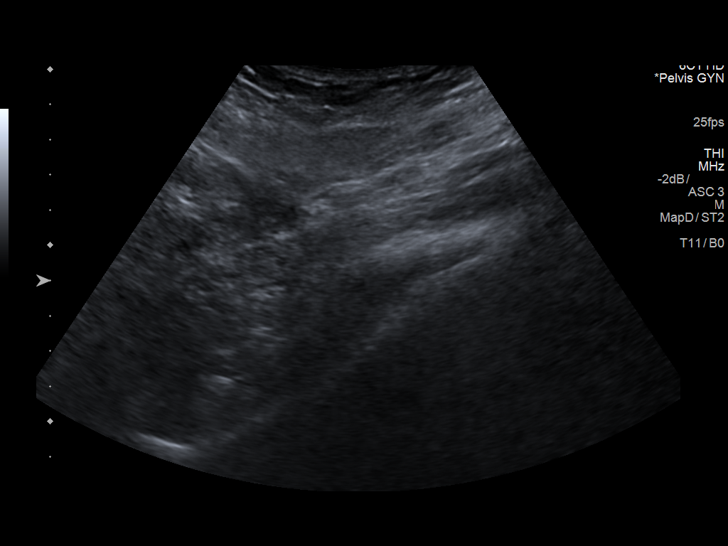
[im 59/71]
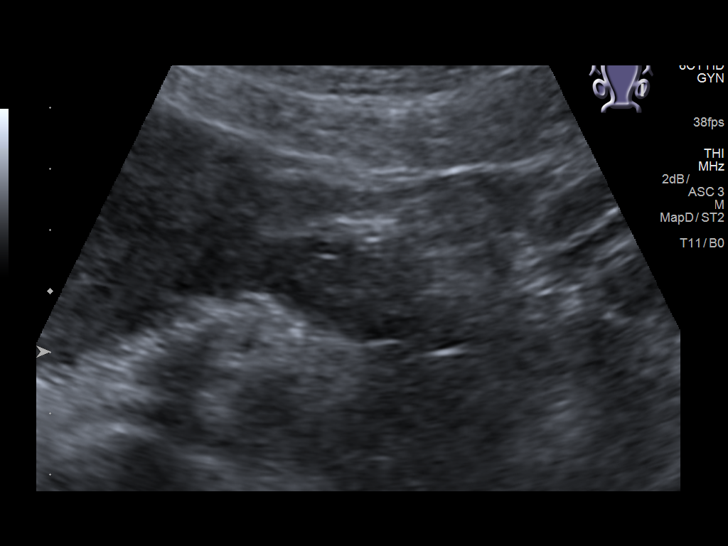
[im 65/71]
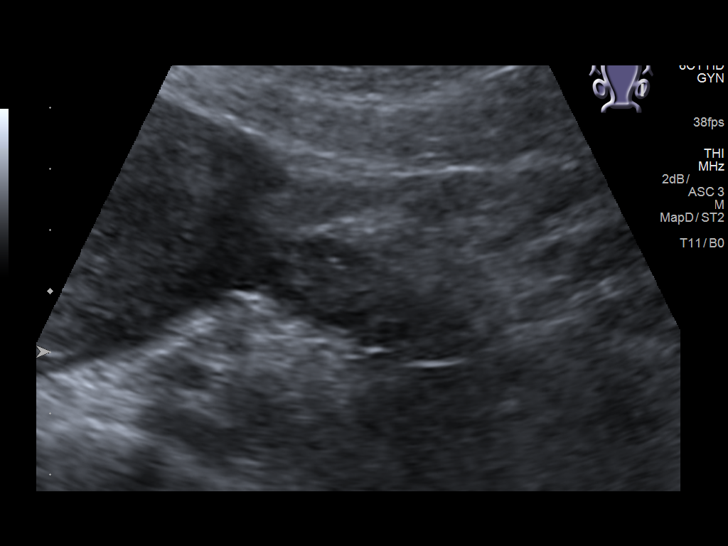
[im 71/71]
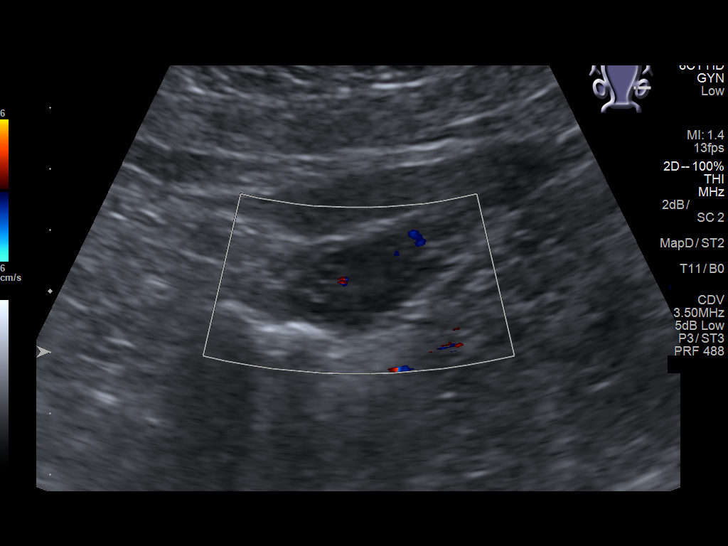

[14 of 25 positions shown; findings below may reference images not displayed]

FINDINGS: Uterus

Measurements: 11.7 x 7.9 x 9.7 cm. No fibroids or other mass
visualized.

Endometrium

Thickness: 3 mm in thickness. Fluid noted within the endometrial
canal.. No focal abnormality visualized.

Right ovary

Measurements: 3.0 x 81.3 x 2.2 cm. Normal appearance/no adnexal
mass.

Left ovary

Measurements: 2.8 x 1.4 x 1.7 cm. Normal appearance/no adnexal mass.

Other findings:  No abnormal free fluid.
IMPRESSION: Small amount of fluid within the endometrial canal, likely with
related to recent postpartum state. No visible retained products of
conception.

## 2019-04-24 ENCOUNTER — Ambulatory Visit: Payer: Self-pay | Attending: Internal Medicine

## 2019-04-24 DIAGNOSIS — Z23 Encounter for immunization: Secondary | ICD-10-CM

## 2019-04-24 NOTE — Progress Notes (Signed)
   Covid-19 Vaccination Clinic  Name:  Violett Hobbs    MRN: [id number] DOB: 07/26/1980  04/24/2019  Ms. Tya Haughey was observed post Covid-19 immunization for 15 minutes without incident. She was provided with Vaccine Information Sheet and instruction to access the V-Safe system.   Ms. Vickey Boak was instructed to call 911 with any severe reactions post vaccine: Marland Kitchen Difficulty breathing  . Swelling of face and throat  . A fast heartbeat  . A bad rash all over body  . Dizziness and weakness   Immunizations Administered    Name Date Dose VIS Date Route   Pfizer COVID-19 Vaccine 04/24/2019  5:36 PM 0.3 mL 02/25/2018 Intramuscular   Manufacturer: [employer], [employer]   Lot: [id number]   NDC: [address]-0

## 2019-05-19 ENCOUNTER — Ambulatory Visit: Payer: Self-pay | Attending: Internal Medicine

## 2019-05-19 DIAGNOSIS — Z23 Encounter for immunization: Secondary | ICD-10-CM

## 2019-05-19 NOTE — Progress Notes (Signed)
   Covid-19 Vaccination Clinic  Name:  Keiasha Diep    MRN: [id number] DOB: [birth year]  05/19/2019  Ms. Amori Cooperman was observed post Covid-19 immunization for 15 minutes without incident. She was provided with Vaccine Information Sheet and instruction to access the V-Safe system.   Ms. Derrika Ruffalo was instructed to call 911 with any severe reactions post vaccine: Marland Kitchen Difficulty breathing  . Swelling of face and throat  . A fast heartbeat  . A bad rash all over body  . Dizziness and weakness   Immunizations Administered    Name Date Dose VIS Date Route   Pfizer COVID-19 Vaccine 05/19/2019  3:35 PM 0.3 mL 02/25/2018 Intramuscular   Manufacturer: [employer], [employer]   Lot: [id number]   NDC: [address]-9

## 2021-10-13 ENCOUNTER — Ambulatory Visit (LOCAL_COMMUNITY_HEALTH_CENTER): Payer: Self-pay | Admitting: Nurse Practitioner

## 2021-10-13 VITALS — BP 123/43 | HR 60 | Ht 65.0 in | Wt 136.2 lb

## 2021-10-13 DIAGNOSIS — Z113 Encounter for screening for infections with a predominantly sexual mode of transmission: Secondary | ICD-10-CM

## 2021-10-13 DIAGNOSIS — Z3009 Encounter for other general counseling and advice on contraception: Secondary | ICD-10-CM

## 2021-10-13 DIAGNOSIS — Z01419 Encounter for gynecological examination (general) (routine) without abnormal findings: Secondary | ICD-10-CM

## 2021-10-13 DIAGNOSIS — Z30432 Encounter for removal of intrauterine contraceptive device: Secondary | ICD-10-CM

## 2021-10-13 LAB — WET PREP FOR TRICH, YEAST, CLUE
Trichomonas Exam: NEGATIVE
Yeast Exam: NEGATIVE

## 2021-10-13 NOTE — Progress Notes (Addendum)
IUD Removal  Patient identified, informed consent performed, consent signed.  Patient was in the dorsal lithotomy position, normal external genitalia was noted.  A speculum was placed in the patient's vagina, normal discharge was noted, no lesions. The cervix was visualized, no lesions, no abnormal discharge.  The strings of the IUD were grasped and pulled using ring forceps. The IUD was removed in its entirety. Patient tolerated the procedure well.    Patient will use condoms for contraception. Routine preventative health maintenance measures emphasized. Gregary Cromer, FNP

## 2021-10-13 NOTE — Progress Notes (Signed)
Pt appt for PE, Pap, IUD removal, and STD Screening. Pt seen by FNP White. Initial lab results reviewed. Family planning packet given.

## 2021-10-13 NOTE — Progress Notes (Addendum)
[facility] [place] Number: [phone]    Family Planning Visit- Initial Visit  Subjective:  Kelsey Gonzalez is a 41 y.o.  [id number]   being seen today for an initial annual visit and to discuss reproductive life planning.  The patient is currently using IUD or IUS for pregnancy prevention. Patient reports   does not want a pregnancy in the next year.     report they are looking for a method that provides Other patient would not like to start a method at this time would like to see how her periods will be without a method.  Patient has the following medical conditions has Uterine contractions during pregnancy on their problem list.  Chief Complaint  Patient presents with   Contraception    Removal of IUD    Patient reports to clinic today for a physical and to have her IUD removed.    Body mass index is 22.66 kg/m. - Patient is eligible for diabetes screening based on BMI and age >48?  yes HA1C ordered? yes  Patient reports 1  partner/s in last year. Desires STI screening?  Yes  Has patient been screened once for HCV in the past?  No  No results found for: "HCVAB"  Does the patient have current drug use (including MJ), have a partner with drug use, and/or has been incarcerated since last result? No  If yes-- Screen for HCV through [facility]   Does the patient meet criteria for HBV testing? No  Criteria:  -Household, sexual or needle sharing contact with HBV -History of drug use -HIV positive -Those with known Hep C   Health Maintenance Due  Topic Date Due   HIV Screening  Never done   Hepatitis C Screening  Never done   TETANUS/TDAP  Never done   PAP SMEAR-Modifier  Never done   COVID-19 Vaccine (3 - Pfizer series) 07/14/2019   INFLUENZA VACCINE  Never done    Review of Systems  Constitutional:  Negative for chills, fever, malaise/fatigue and weight loss.  HENT:  Negative for  congestion, hearing loss and sore throat.   Eyes:  Negative for blurred vision, double vision and photophobia.  Respiratory:  Negative for shortness of breath.   Cardiovascular:  Negative for chest pain.  Gastrointestinal:  Negative for abdominal pain, blood in stool, constipation, diarrhea, heartburn, nausea and vomiting.  Genitourinary:  Negative for dysuria and frequency.  Musculoskeletal:  Negative for back pain, joint pain and neck pain.  Skin:  Negative for itching and rash.  Neurological:  Negative for dizziness, weakness and headaches.  Endo/Heme/Allergies:  Does not bruise/bleed easily.  Psychiatric/Behavioral:  Negative for depression, substance abuse and suicidal ideas.     The following portions of the patient's history were reviewed and updated as appropriate: allergies, current medications, past family history, past medical history, past social history, past surgical history and problem list. Problem list updated.   See flowsheet for other program required questions.  Objective:   Vitals:   10/13/21 1518  BP: (!) 123/43  Pulse: 60  Weight: 136 lb 3.2 oz (61.8 kg)  Height: 5\' 5"  (1.651 m)    Physical Exam Constitutional:      Appearance: Normal appearance.  HENT:     Head: Normocephalic. No abrasion, masses or laceration. Hair is normal.     Jaw: No tenderness or swelling.     Right Ear: External ear normal.     Left  Ear: External ear normal.     Nose: Nose normal.     Mouth/Throat:     Lips: Pink. No lesions.     Mouth: Mucous membranes are moist. No lacerations or oral lesions.     Dentition: No dental caries.     Tongue: No lesions.     Palate: No mass and lesions.     Pharynx: No pharyngeal swelling, oropharyngeal exudate, posterior oropharyngeal erythema or uvula swelling.     Tonsils: No tonsillar exudate or tonsillar abscesses.     Comments: Missing dentition  Eyes:     Pupils: Pupils are equal, round, and reactive to light.  Neck:     Thyroid: No  thyroid mass, thyromegaly or thyroid tenderness.  Cardiovascular:     Rate and Rhythm: Normal rate and regular rhythm.  Pulmonary:     Effort: Pulmonary effort is normal.     Breath sounds: Normal breath sounds.  Chest:  Breasts:    Right: Normal. No swelling, mass, nipple discharge, skin change or tenderness.     Left: Normal. No swelling, mass, nipple discharge, skin change or tenderness.  Abdominal:     General: Abdomen is flat. Bowel sounds are normal.     Palpations: Abdomen is soft.     Tenderness: There is no abdominal tenderness. There is no rebound.  Genitourinary:    Pubic Area: No rash or pubic lice.      Labia:        Right: No rash, tenderness or lesion.        Left: No rash, tenderness or lesion.      Vagina: Normal. No vaginal discharge, erythema, tenderness or lesions.     Cervix: No cervical motion tenderness, discharge, lesion or erythema.     Uterus: Normal.      Adnexa:        Right: No tenderness.         Left: No tenderness.       Rectum: External hemorrhoid present.     Comments: Amount Discharge: small  Odor: No pH: greater than 4.5 Adheres to vaginal wall: No Color: spotting   Musculoskeletal:     Cervical back: Full passive range of motion without pain and normal range of motion.  Lymphadenopathy:     Cervical: No cervical adenopathy.     Right cervical: No superficial, deep or posterior cervical adenopathy.    Left cervical: No superficial, deep or posterior cervical adenopathy.     Upper Body:     Right upper body: No supraclavicular, axillary or epitrochlear adenopathy.     Left upper body: No supraclavicular, axillary or epitrochlear adenopathy.     Lower Body: No right inguinal adenopathy. No left inguinal adenopathy.  Skin:    General: Skin is warm and dry.     Findings: No erythema, laceration, lesion or rash.  Neurological:     Mental Status: She is alert and oriented to person, place, and time.  Psychiatric:        Attention and  Perception: Attention normal.        Mood and Affect: Mood normal.        Speech: Speech normal.        Behavior: Behavior normal. Behavior is cooperative.       Assessment and Plan:  Kelsey Gonzalez is a 41 y.o. female presenting to the [facility] Department for an initial annual wellness/contraceptive visit  Contraception counseling: Reviewed options based on patient desire and reproductive life  plan. Patient is interested in Female Condom. This was provided to the patient today.   Risks, benefits, and typical effectiveness rates were reviewed.  Questions were answered.  Written information was also given to the patient to review.    The patient will follow up in  1 years for surveillance.  The patient was told to call with any further questions, or with any concerns about this method of contraception.  Emphasized use of condoms 100% of the time for STI prevention.  Need for ECP was assessed. ECP not offered due to continued use of a birth control method.    1. Screening examination for venereal disease -STD screening today. -Patient accepted all screenings including vaginal CT/GC, wet prep and declines bloodwork for HIV/RPR.  Patient meets criteria for HepB screening? No. Ordered? No - low risk Patient meets criteria for HepC screening? No. Ordered? No - low risk   Treat wet prep per standing order Discussed time line for [facility] results and that patient will be called with positive results and encouraged patient to call if she had not heard in 2 weeks.  Counseled to return or seek care for continued or worsening symptoms Recommended condom use with all sex  Patient is currently using  IUD  to prevent pregnancy.    - Chlamydia/Gonorrhea [place] [facility] - WET PREP FOR TRICH, YEAST, CLUE  2. Family planning counseling -41 year old female in clinic for a physical and IUD removal. -STD screening offered. -HgbA1C offered per guidelines.  -ROS reviewed, patient  with no complaints today. -Patient had questions regarding menopause and signs and symptoms.  Information provided to patient. -Patient desires to have IUD removed and only use condoms to see how her period will be without a method. Patient informed to return back to clinic if she decides to receive another method.    - Hemoglobin A1C  3. Well woman exam with routine gynecological exam -Normal well woman exam. -PAP today. -CBE today, next due 10/2022  - IGP, Aptima HPV   4. Encounter for IUD removal -See IUD removal note.      Total time spent: 30 minutes   Return in about 1 year (around 10/14/2022) for Annual well-woman exam.  Glenna Fellows, FNP

## 2021-10-14 LAB — HEMOGLOBIN A1C
Est. average glucose Bld gHb Est-mCnc: 126 mg/dL
Hgb A1c MFr Bld: 6 % — ABNORMAL HIGH (ref 4.8–5.6)

## 2021-10-19 LAB — IGP, APTIMA HPV
HPV Aptima: NEGATIVE
PAP Smear Comment: 0

## 2021-11-15 ENCOUNTER — Ambulatory Visit (LOCAL_COMMUNITY_HEALTH_CENTER): Payer: Self-pay | Admitting: Nurse Practitioner

## 2021-11-15 VITALS — BP 111/61 | Ht 65.0 in | Wt 139.0 lb

## 2021-11-15 DIAGNOSIS — Z3009 Encounter for other general counseling and advice on contraception: Secondary | ICD-10-CM

## 2021-11-15 MED ORDER — LEVONORGESTREL 1.5 MG PO TABS: 1.5000 mg | ORAL_TABLET | Freq: Once | ORAL | 0 refills | Status: AC

## 2021-11-15 NOTE — Progress Notes (Signed)
Pt appointment for acute visit related to Pap followup. Seen by FNP White.

## 2021-11-15 NOTE — Progress Notes (Signed)
[facility] problem [facility]  [facility]  Subjective:  Kelsey Gonzalez is a 41 y.o. being seen today to receive PAP results.    Chief Complaint  Patient presents with   Contraception    Redo pap     HPI   Does the patient have a current or past history of drug use? No     Health Maintenance Due  Topic Date Due   HIV Screening  Never done   Hepatitis C Screening  Never done   TETANUS/TDAP  Never done   COVID-19 Vaccine (3 - Pfizer series) 07/14/2019   INFLUENZA VACCINE  Never done    Review of Systems  Constitutional:  Negative for chills, fever, malaise/fatigue and weight loss.  HENT:  Negative for congestion, hearing loss and sore throat.   Eyes:  Negative for blurred vision, double vision and photophobia.  Respiratory:  Negative for shortness of breath.   Cardiovascular:  Negative for chest pain.  Gastrointestinal:  Negative for abdominal pain, blood in stool, constipation, diarrhea, heartburn, nausea and vomiting.  Genitourinary:  Negative for dysuria and frequency.  Musculoskeletal:  Negative for back pain, joint pain and neck pain.  Skin:  Negative for itching and rash.  Neurological:  Negative for dizziness, weakness and headaches.  Endo/Heme/Allergies:  Does not bruise/bleed easily.  Psychiatric/Behavioral:  Negative for depression, substance abuse and suicidal ideas.     The following portions of the patient's history were reviewed and updated as appropriate: allergies, current medications, past family history, past medical history, past social history, past surgical history and problem list. Problem list updated.   See flowsheet for other program required questions.  Objective:   Vitals:   11/15/21 1437  BP: 111/61  Weight: 139 lb (63 kg)  Height: 5\' 5"  (1.651 m)    Physical Exam Constitutional:      Appearance: Normal appearance.  HENT:     Head: Normocephalic. No abrasion, masses or laceration.  Hair is normal.     Jaw: No tenderness or swelling.     Right Ear: External ear normal.     Left Ear: External ear normal.     Nose: Nose normal.     Mouth/Throat:     Lips: Pink. No lesions.     Mouth: Mucous membranes are moist. No lacerations or oral lesions.     Dentition: No dental caries.     Tongue: No lesions.     Palate: No mass and lesions.     Pharynx: No pharyngeal swelling, oropharyngeal exudate, posterior oropharyngeal erythema or uvula swelling.     Tonsils: No tonsillar exudate or tonsillar abscesses.  Pulmonary:     Effort: Pulmonary effort is normal.  Musculoskeletal:     Cervical back: Full passive range of motion without pain and normal range of motion.  Skin:    General: Skin is warm and dry.  Neurological:     Mental Status: She is alert and oriented to person, place, and time.  Psychiatric:        Attention and Perception: Attention normal.        Mood and Affect: Mood normal.        Speech: Speech normal.        Behavior: Behavior normal. Behavior is cooperative.       Assessment and Plan:  Kelsey Gonzalez is a 41 y.o. female presenting to the [facility] Department for a Women's Health problem visit  1. Family planning counseling -  41 year old female in clinic today for PAP results, next PAP due 10/2026. Patient had a physical 10/13/21.  At that time her IUD was removed and she stated that she would like to exclusively use condoms.  Patient reports today that she has not been using condoms and would like her IUD inserted.  Last LMP 11/05/21, which was abnormal and last sex was 11/10/21.  Patient offered Plan B and encouraged to use condoms with all sex until scheduled IUD appointment.  Patient will need an updated GC/CT and PT prior to insertion.   - levonorgestrel (PLAN B ONE-STEP) 1.5 MG tablet; Take 1 tablet (1.5 mg total) by mouth once for 1 dose.  Dispense: 1 tablet; Refill: 0   Total time spent: 20 minutes   Return in 11 months  (on 10/14/2022) for Annual well-woman exam.  Future Appointments  Date Time Provider Department Center  12/07/2021  4:00 PM [facility] PROVIDER [facility] None    Glenna Fellows, FNP

## 2021-12-07 ENCOUNTER — Ambulatory Visit: Payer: Self-pay

## 2021-12-07 ENCOUNTER — Ambulatory Visit (LOCAL_COMMUNITY_HEALTH_CENTER): Payer: Self-pay | Admitting: Family Medicine

## 2021-12-07 DIAGNOSIS — Z30430 Encounter for insertion of intrauterine contraceptive device: Secondary | ICD-10-CM

## 2021-12-07 DIAGNOSIS — Z3202 Encounter for pregnancy test, result negative: Secondary | ICD-10-CM

## 2021-12-07 DIAGNOSIS — Z3200 Encounter for pregnancy test, result unknown: Secondary | ICD-10-CM

## 2021-12-07 DIAGNOSIS — Z3009 Encounter for other general counseling and advice on contraception: Secondary | ICD-10-CM

## 2021-12-07 DIAGNOSIS — Z309 Encounter for contraceptive management, unspecified: Secondary | ICD-10-CM

## 2021-12-07 LAB — PREGNANCY, URINE: Preg Test, Ur: NEGATIVE

## 2021-12-07 MED ORDER — LEVONORGESTREL 20.1 MCG/DAY IU IUD
1.0000 | INTRAUTERINE_SYSTEM | Freq: Once | INTRAUTERINE | Status: AC
  Administered 2021-12-07: 1 via INTRAUTERINE

## 2021-12-07 NOTE — Progress Notes (Addendum)
Acute office visit for IUD insertion. Pt. Seen by MD Alvester Morin and FNP Sydnee Levans. PT results discussed with pt.

## 2021-12-07 NOTE — Progress Notes (Signed)
  [facility] Department   Patient presented to [facility] for IUD insertion. Her GC/CT screening was found to be up to date and using WHO criteria we can be reasonably certain she is not pregnant or a pregnancy test was obtained which was Urine pregnancy test  today was Negative.  See Flowsheet for IUD check list  IUD Insertion Procedure Note Patient identified, informed consent performed, consent signed.   Discussed risks of irregular bleeding, cramping, infection, malpositioning or misplacement of the IUD outside the uterus which may require further procedure such as laparoscopy. Time out was performed.    Speculum placed in the vagina.  Cervix visualized.  Cleaned with Betadine x 2.  Grasped anteriorly with a single tooth tenaculum.  Uterus sounded to 8 cm.  IUD placed per manufacturer's recommendations.  Strings trimmed to 3 cm. Tenaculum was removed, good hemostasis noted.  Patient tolerated procedure well.   Patient was given post-procedure instructions- both agency handout and verbally by provider.  She was advised to have backup contraception for one week.  Patient was also asked to check IUD strings periodically or follow up in 4 weeks for IUD check.

## 2022-02-22 NOTE — Addendum Note (Signed)
Addended by: Sharlet Salina on: 02/22/2022 08:12 AM   Modules accepted: Level of Service

## 2022-05-17 ENCOUNTER — Ambulatory Visit (LOCAL_COMMUNITY_HEALTH_CENTER): Payer: Self-pay | Admitting: Family Medicine

## 2022-05-17 ENCOUNTER — Encounter: Payer: Self-pay | Admitting: Family Medicine

## 2022-05-17 VITALS — BP 106/70 | HR 63 | Ht 65.0 in | Wt 156.0 lb

## 2022-05-17 DIAGNOSIS — Z3009 Encounter for other general counseling and advice on contraception: Secondary | ICD-10-CM

## 2022-05-17 DIAGNOSIS — Z30432 Encounter for removal of intrauterine contraceptive device: Secondary | ICD-10-CM

## 2022-05-17 DIAGNOSIS — Z309 Encounter for contraceptive management, unspecified: Secondary | ICD-10-CM

## 2022-05-17 MED ORDER — NORGESTIM-ETH ESTRAD TRIPHASIC 0.18/0.215/0.25 MG-25 MCG PO TABS: 1.0000 | ORAL_TABLET | Freq: Every day | ORAL | 12 refills | Status: AC

## 2022-05-17 NOTE — Progress Notes (Signed)
[facility] Problem Visit  [facility]  Subjective:  Kelsey Gonzalez is a 42 y.o. being seen today for   Chief Complaint  Patient presents with   Acute Visit    Here to remove IUD and discuss other options    HPI  Pt had a physical in 10/2021, at that time she had her IUD removed, and stated she wanted to use condoms. She returned to clinic in December 2023, and requested an IUD be placed (Liletta done), she is now back and asking for her IUD to be removed. Counseled that the device is expensive and it is not meant to be removed and replaced multiple times in 1 year. She is worried because she doesn't have her period and she said she has swelling in her feet. I counseled her that not having a period is expected with this IUD. I also counseled her that leg swelling, is generally not a symptom from an IUD and I don't think that removing the IUD would help that.    Health Maintenance Due  Topic Date Due   HIV Screening  Never done   Hepatitis C Screening  Never done   DTaP/Tdap/Td (1 - Tdap) Never done   COVID-19 Vaccine (3 - 2023-24 season) 09/01/2021    ROS  The following portions of the patient's history were reviewed and updated as appropriate: allergies, current medications, past family history, past medical history, past social history, past surgical history and problem list. Problem list updated.   See flowsheet for other program required questions.  Objective:   Vitals:   05/17/22 0833  BP: 106/70  Pulse: 63  Weight: 156 lb (70.8 kg)  Height: 5\' 5"  (1.651 m)    Physical Exam Vitals and nursing note reviewed.  Constitutional:      Appearance: Normal appearance.  HENT:     Head: Normocephalic and atraumatic.     Mouth/Throat:     Mouth: Mucous membranes are moist.     Pharynx: Oropharynx is clear. No oropharyngeal exudate or posterior oropharyngeal erythema.  Pulmonary:     Effort: Pulmonary effort is normal.   Abdominal:     General: Abdomen is flat.     Palpations: There is no mass.     Tenderness: There is no abdominal tenderness. There is no rebound.  Genitourinary:    General: Normal vulva.     Exam position: Lithotomy position.     Pubic Area: No rash or pubic lice.      Labia:        Right: No rash or lesion.        Left: No rash or lesion.      Vagina: Vaginal discharge present. No erythema, bleeding or lesions.     Cervix: No cervical motion tenderness, discharge, friability, lesion or erythema.     Uterus: Normal.      Adnexa: Right adnexa normal and left adnexa normal.     Rectum: Normal.     Comments: IUD removed from cervical os  Lymphadenopathy:     Head:     Right side of head: No preauricular or posterior auricular adenopathy.     Left side of head: No preauricular or posterior auricular adenopathy.     Cervical: No cervical adenopathy.     Upper Body:     Right upper body: No supraclavicular, axillary or epitrochlear adenopathy.     Left upper body: No supraclavicular, axillary or epitrochlear adenopathy.     Lower  Body: No right inguinal adenopathy. No left inguinal adenopathy.  Skin:    General: Skin is warm and dry.     Findings: No rash.  Neurological:     Mental Status: She is alert and oriented to person, place, and time.      Assessment and Plan:  Kelsey Gonzalez is a 42 y.o. female presenting to the [facility] Department for a Women's Health problem visit  1. Encounter for IUD removal Pt strongly wanted her IUD removed.   IUD Removal  Patient identified, informed consent performed, consent signed.  Patient was in the dorsal lithotomy position, normal external genitalia was noted.  A speculum was placed in the patient's vagina, normal discharge was noted, no lesions. The cervix was visualized, no lesions, no abnormal discharge.  The strings of the IUD were grasped and pulled using ring forceps. The IUD was removed in its entirety.    Patient tolerated the procedure well.    Patient will use OCPs for contraception.  2. Family planning  - Norgestimate-Ethinyl Estradiol Triphasic (TRI-LO-SPRINTEC) 0.18/0.215/0.25 MG-25 MCG tab; Take 1 tablet by mouth daily.  Dispense: 28 tablet; Refill: 12  No follow-ups on file.  No future appointments. Due to language barrier, interpreter Marlene Yemen was present for this visit.  Kelsey Gonzalez, [place]

## 2022-05-17 NOTE — Progress Notes (Signed)
Pt here for removal of IUD.  Wants to change to oral contraceptive pills.  Tri Lo Mili 3 boxes (6 packs) dispensed to patient. Stressed importance of taking daily at same time each day and to use condoms as back up method x 14 days.  Verbalizes understanding.  Condoms given.-Collins Scotland, RN
